# Patient Record
Sex: Female | Born: 2000 | Race: Black or African American | Hispanic: No | Marital: Single | Smoking: Never smoker
Health system: Southern US, Community
[De-identification: ages and names within clinical notes are randomized; demographics above are authoritative.]

## PROBLEM LIST (undated history)

## (undated) DIAGNOSIS — N6320 Unspecified lump in the left breast, unspecified quadrant: Secondary | ICD-10-CM

## (undated) DIAGNOSIS — M5126 Other intervertebral disc displacement, lumbar region: Secondary | ICD-10-CM

## (undated) DIAGNOSIS — M069 Rheumatoid arthritis, unspecified: Secondary | ICD-10-CM

## (undated) DIAGNOSIS — M419 Scoliosis, unspecified: Secondary | ICD-10-CM

## (undated) HISTORY — PX: WISDOM TOOTH EXTRACTION: SHX21

## (undated) HISTORY — PX: BACK SURGERY: SHX140

---

## 2018-05-20 ENCOUNTER — Encounter (HOSPITAL_COMMUNITY): Payer: Self-pay | Admitting: Emergency Medicine

## 2018-05-20 ENCOUNTER — Emergency Department (HOSPITAL_COMMUNITY)
Admission: EM | Admit: 2018-05-20 | Discharge: 2018-05-20 | Disposition: A | Discharge status: Home / Self Care | Payer: PRIVATE HEALTH INSURANCE | Attending: Emergency Medicine | Admitting: Emergency Medicine

## 2018-05-20 DIAGNOSIS — M5441 Lumbago with sciatica, right side: Secondary | ICD-10-CM | POA: Insufficient documentation

## 2018-05-20 DIAGNOSIS — R300 Dysuria: Secondary | ICD-10-CM | POA: Insufficient documentation

## 2018-05-20 DIAGNOSIS — G8929 Other chronic pain: Secondary | ICD-10-CM | POA: Insufficient documentation

## 2018-05-20 DIAGNOSIS — M419 Scoliosis, unspecified: Secondary | ICD-10-CM | POA: Diagnosis not present

## 2018-05-20 DIAGNOSIS — M069 Rheumatoid arthritis, unspecified: Secondary | ICD-10-CM | POA: Diagnosis not present

## 2018-05-20 HISTORY — DX: Rheumatoid arthritis, unspecified: M06.9

## 2018-05-20 HISTORY — DX: Scoliosis, unspecified: M41.9

## 2018-05-20 HISTORY — DX: Unspecified lump in the left breast, unspecified quadrant: N63.20

## 2018-05-20 HISTORY — DX: Other intervertebral disc displacement, lumbar region: M51.26

## 2018-05-20 LAB — URINALYSIS, ROUTINE W REFLEX MICROSCOPIC
Bilirubin Urine: NEGATIVE
Glucose, UA: NEGATIVE mg/dL
Hgb urine dipstick: NEGATIVE
KETONES UR: NEGATIVE mg/dL
Leukocytes,Ua: NEGATIVE
Nitrite: POSITIVE — AB
Protein, ur: NEGATIVE mg/dL
Specific Gravity, Urine: 1.017 (ref 1.005–1.030)
pH: 6 (ref 5.0–8.0)

## 2018-05-20 MED ORDER — CELECOXIB 200 MG PO CAPS: 200.0000 mg | ORAL_CAPSULE | Freq: Two times a day (BID) | ORAL | 0 refills | Status: DC

## 2018-05-20 NOTE — ED Notes (Signed)
ED Provider at bedside. 

## 2018-05-20 NOTE — ED Triage Notes (Signed)
Pt reports severe back pain due to rheumatoid arthritis (not on meds for it since she moved), pt states it is worse than normal today and was unable to go to class due to the pain.

## 2018-05-20 NOTE — Discharge Instructions (Signed)
Your urine did not show infection but culture was sent.  You will be notified if it shows an infection.

## 2018-05-20 NOTE — ED Notes (Signed)
Urine Culture sent to main lab with UA.  

## 2018-05-20 NOTE — ED Provider Notes (Signed)
[facility] EMERGENCY DEPARTMENT Provider Note   CSN: [id number] Arrival date & time: 05/20/18  1131     History   Chief Complaint Chief Complaint  Patient presents with  . Back Pain    HPI Molly Mahoney is a 18 y.o. female.  HPI Patient has a history of rheumatoid arthritis.  Has been off her meds for a while.  Has been mostly on Celebrex 200 mg twice a day for pain control.  Was not any other suppressing drugs.  States she ran out of the Celebrex around 3 weeks ago.  Pain now increasing.  It is in her lower back and does go to the right leg.  This is her typical pain for her.  No fevers or chills.  No cough.  No new numbness or weakness.  Denies any possibility of pregnancy.  Denies vaginal discharge.  States that she has had some foul-smelling urine and is worried she could have an infection.  No chest pain.  No trauma. Past Medical History:  Diagnosis Date  . Left breast lump    benign  . Lumbar herniated disc   . Rheumatoid arthritis (HCC)   . Scoliosis     There are no active problems to display for this patient.   Past Surgical History:  Procedure Laterality Date  . BACK SURGERY    . WISDOM TOOTH EXTRACTION       OB History   No obstetric history on file.      Home Medications    Prior to Admission medications   Medication Sig Start Date End Date Taking? Authorizing Provider  celecoxib (CELEBREX) 200 MG capsule Take 1 capsule (200 mg total) by mouth 2 (two) times daily. 05/20/18   Benjiman Core, MD    Family History History reviewed. No pertinent family history.  Social History Social History   Tobacco Use  . Smoking status: Never Smoker  . Smokeless tobacco: Never Used  Substance Use Topics  . Alcohol use: Not Currently  . Drug use: Never     Allergies   Cortizone-10 [hydrocortisone]   Review of Systems Review of Systems  Constitutional: Negative for chills and fever.  Cardiovascular: Negative for chest  pain.  Gastrointestinal: Negative for abdominal pain.  Genitourinary: Positive for dysuria.  Musculoskeletal: Positive for back pain. Negative for gait problem.  Skin: Negative for rash.  Neurological: Negative for weakness and numbness.  Psychiatric/Behavioral: Negative for confusion.     Physical Exam Updated Vital Signs BP 136/69 (BP Location: Right Arm)   Pulse (!) 111   Temp 98.4 F (36.9 C) (Oral)   Resp 18   LMP 04/30/2018 (Exact Date)   SpO2 98%   Physical Exam HENT:     Head: Atraumatic.     Mouth/Throat:     Mouth: Mucous membranes are moist.  Eyes:     Extraocular Movements: Extraocular movements intact.  Neck:     Musculoskeletal: Neck supple.  Cardiovascular:     Rate and Rhythm: Regular rhythm.  Pulmonary:     Breath sounds: Normal breath sounds.  Abdominal:     Tenderness: There is no abdominal tenderness.  Musculoskeletal:     Comments: Mild lumbar paraspinal tenderness.  No deformity.  Skin:    Capillary Refill: Capillary refill takes less than 2 seconds.  Neurological:     General: No focal deficit present.     Mental Status: She is alert.     Comments: Sensation intact in both lower legs.  Normal gait.  Psychiatric:        Mood and Affect: Mood normal.      ED Treatments / Results  Labs (all labs ordered are listed, but only abnormal results are displayed) Labs Reviewed  URINALYSIS, ROUTINE W REFLEX MICROSCOPIC - Abnormal; Notable for the following components:      Result Value   APPearance HAZY (*)    Nitrite POSITIVE (*)    Bacteria, UA RARE (*)    All other components within normal limits  URINE CULTURE    EKG None  Radiology No results found.  Procedures Procedures (including critical care time)  Medications Ordered in ED Medications - No data to display   Initial Impression / Assessment and Plan / ED Course  I have reviewed the triage vital signs and the nursing notes.  Pertinent labs & imaging results that were  available during my care of the patient were reviewed by me and considered in my medical decision making (see chart for details).     Patient with acute on chronic back pain secondary to rheumatoid arthritis.  Out of her Celebrex.  Benign exam.  Celebrex refilled.  Also has had some dysuria.  Urine nonspecific.  Culture sent.  Discharge home.  Patient denies possibility of pregnancy  Final Clinical Impressions(s) / ED Diagnoses   Final diagnoses:  Rheumatoid arthritis, involving unspecified site, unspecified rheumatoid factor presence (HCC)  Chronic low back pain with right-sided sciatica, unspecified back pain laterality    ED Discharge Orders         Ordered    celecoxib (CELEBREX) 200 MG capsule  2 times daily     05/20/18 1343           Benjiman Core, MD 05/20/18 1350

## 2018-05-20 NOTE — ED Notes (Signed)
Patient verbalizes understanding of discharge instructions. Opportunity for questioning and answers were provided. Armband removed by staff, pt discharged from ED.  

## 2018-05-22 LAB — URINE CULTURE: Culture: >=100000 — AB

## 2018-05-23 ENCOUNTER — Telehealth: Payer: Self-pay | Admitting: Emergency Medicine

## 2018-05-23 NOTE — Progress Notes (Signed)
ED Antimicrobial Stewardship Positive Culture Follow Up   Molly Mahoney is an 18 y.o. female who presented to [facility] on 05/20/2018 with a chief complaint of  Chief Complaint  Patient presents with  . Back Pain    Recent Results (from the past 720 hour(s))  Urine culture     Status: Abnormal   Collection Time: 05/20/18  1:00 PM  Result Value Ref Range Status   Specimen Description URINE, RANDOM  Final   Special Requests   Final    NONE Performed at [facility], [address] [place], [place] [address]    Culture >=100,000 COLONIES/mL ESCHERICHIA COLI (A)  Final   Report Status 05/22/2018 FINAL  Final   Organism ID, Bacteria ESCHERICHIA COLI (A)  Final      Susceptibility   Escherichia coli - MIC*    AMPICILLIN 4 SENSITIVE Sensitive     CEFAZOLIN <=4 SENSITIVE Sensitive     CEFTRIAXONE <=1 SENSITIVE Sensitive     CIPROFLOXACIN <=0.25 SENSITIVE Sensitive     GENTAMICIN <=1 SENSITIVE Sensitive     IMIPENEM <=0.25 SENSITIVE Sensitive     NITROFURANTOIN <=16 SENSITIVE Sensitive     TRIMETH/SULFA >=320 RESISTANT Resistant     AMPICILLIN/SULBACTAM <=2 SENSITIVE Sensitive     PIP/TAZO <=4 SENSITIVE Sensitive     Extended ESBL NEGATIVE Sensitive     * >=100,000 COLONIES/mL ESCHERICHIA COLI    [x]  Patient discharged originally without antimicrobial agent and treatment is now indicated  New antibiotic prescription: Keflex 500 mg po BID for 10 days.  ED Provider: Army Melia, PA-C   Fayne Norrie 05/23/2018, 10:52 AM Clinical Pharmacist Monday - Friday phone -  [phone] Saturday - Sunday phone - [phone]

## 2018-05-23 NOTE — Telephone Encounter (Signed)
Post ED Visit - Positive Culture Follow-up: Successful Patient Follow-Up  Culture assessed and recommendations reviewed by:  []  Enzo Bi, Pharm.D. []  Celedonio Miyamoto, [address].D., BCPS AQ-ID []  Garvin Fila, Pharm.D., BCPS []  Georgina Pillion, Pharm.D., BCPS []  [place], [address].D., BCPS, AAHIVP []  Estella Husk, Pharm.D., BCPS, AAHIVP []  Lysle Pearl, PharmD, BCPS []  Phillips Climes, PharmD, BCPS []  Agapito Games, PharmD, BCPS []  Verlan Friends, PharmD Link Snuffer PharmD  Positive urine culture  [x]  Patient discharged without antimicrobial prescription and treatment is now indicated []  Organism is resistant to prescribed ED discharge antimicrobial []  Patient with positive blood cultures  Changes discussed with ED provider: Army Melia PA New antibiotic prescription start Keflex 500mg  po bid x 10 days  Attempting to contact patient   Berle Mull 05/23/2018, 12:53 PM

## 2018-05-26 ENCOUNTER — Encounter (HOSPITAL_COMMUNITY): Payer: Self-pay | Admitting: *Deleted

## 2018-05-26 ENCOUNTER — Emergency Department (HOSPITAL_COMMUNITY)
Admission: EM | Admit: 2018-05-26 | Discharge: 2018-05-26 | Disposition: A | Discharge status: Home / Self Care | Payer: PRIVATE HEALTH INSURANCE | Attending: Emergency Medicine | Admitting: Emergency Medicine

## 2018-05-26 DIAGNOSIS — R110 Nausea: Secondary | ICD-10-CM | POA: Insufficient documentation

## 2018-05-26 LAB — POC URINE PREG, ED: Preg Test, Ur: NEGATIVE

## 2018-05-26 MED ORDER — ONDANSETRON 4 MG PO TBDP
4.0000 mg | ORAL_TABLET | Freq: Once | ORAL | Status: AC
  Administered 2018-05-26: 4 mg via ORAL
  Filled 2018-05-26: qty 1

## 2018-05-26 MED ORDER — ONDANSETRON HCL 4 MG PO TABS: 4.0000 mg | ORAL_TABLET | Freq: Four times a day (QID) | ORAL | 0 refills | Status: DC

## 2018-05-26 NOTE — ED Triage Notes (Signed)
Pt in c/o nausea and back pain, history of RA and has been taking NSAIDS for her pain, unsure if that is causing her nausea

## 2018-05-26 NOTE — Discharge Instructions (Addendum)
Please read attached information. If you experience any new or worsening signs or symptoms please return to the emergency room for evaluation. Please follow-up with your primary care provider or specialist as discussed. Please use medication prescribed only as directed and discontinue taking if you have any concerning signs or symptoms.   °

## 2018-05-26 NOTE — ED Provider Notes (Signed)
[facility] EMERGENCY DEPARTMENT Provider Note   CSN: [id number] Arrival date & time: 05/26/18  1231    History   Chief Complaint Chief Complaint  Patient presents with  . Nausea    HPI Bernise Mkenzie Tacker is a 18 y.o. female.     HPI   18 year old female with a history of rheumatoid arthritis presents today with complaints of pain and nausea.  Patient notes chronic pain that is generally resolved with Celebrex.  She notes she recently moved here from [place] via [place].  She was seen in the emergency room last week with pain.  She was given prescription for her Celebrex which she notes has been improving her symptoms.  She notes last night she took her Celebrex developed nausea and was unable to keep food or drink down.  Patient notes she has tolerated p.o. today but still is nauseous and having chronic back pain typical of previous pain.  She denies any fever, denies any abdominal pain.  She notes she is hungry but does not want to eat secondary to nausea..   Past Medical History:  Diagnosis Date  . Left breast lump    benign  . Lumbar herniated disc   . Rheumatoid arthritis (HCC)   . Scoliosis     There are no active problems to display for this patient.   Past Surgical History:  Procedure Laterality Date  . BACK SURGERY    . WISDOM TOOTH EXTRACTION       OB History   No obstetric history on file.      Home Medications    Prior to Admission medications   Medication Sig Start Date End Date Taking? Authorizing Provider  celecoxib (CELEBREX) 200 MG capsule Take 1 capsule (200 mg total) by mouth 2 (two) times daily. 05/20/18   Benjiman Core, MD  ondansetron (ZOFRAN) 4 MG tablet Take 1 tablet (4 mg total) by mouth every 6 (six) hours. 05/26/18   Eyvonne Mechanic, PA-C    Family History History reviewed. No pertinent family history.  Social History Social History   Tobacco Use  . Smoking status: Never Smoker  . Smokeless tobacco:  Never Used  Substance Use Topics  . Alcohol use: Not Currently  . Drug use: Never     Allergies   Cortizone-10 [hydrocortisone]   Review of Systems Review of Systems  All other systems reviewed and are negative.    Physical Exam Updated Vital Signs BP 112/79 (BP Location: Left Arm)   Pulse 83   Temp 98.7 F (37.1 C) (Oral)   Resp 18   LMP 04/30/2018 (Exact Date)   SpO2 100%   Physical Exam Vitals signs and nursing note reviewed.  Constitutional:      Appearance: She is well-developed.  HENT:     Head: Normocephalic and atraumatic.  Eyes:     General: No scleral icterus.       Right eye: No discharge.        Left eye: No discharge.     Conjunctiva/sclera: Conjunctivae normal.     Pupils: Pupils are equal, round, and reactive to light.  Neck:     Musculoskeletal: Normal range of motion.     Vascular: No JVD.     Trachea: No tracheal deviation.  Pulmonary:     Effort: Pulmonary effort is normal.     Breath sounds: No stridor.  Abdominal:     Comments: Abdomen soft nontender  Musculoskeletal:     Comments: Generalized tenderness to  palpation of the cervical thoracic and lumbar regions, nonfocal, no rashes, distal sensation strength motor function intact  Neurological:     Mental Status: She is alert and oriented to person, place, and time.     Coordination: Coordination normal.  Psychiatric:        Behavior: Behavior normal.        Thought Content: Thought content normal.        Judgment: Judgment normal.      ED Treatments / Results  Labs (all labs ordered are listed, but only abnormal results are displayed) Labs Reviewed  POC URINE PREG, ED    EKG None  Radiology No results found.  Procedures Procedures (including critical care time)  Medications Ordered in ED Medications  ondansetron (ZOFRAN-ODT) disintegrating tablet 4 mg (4 mg Oral Given 05/26/18 1530)     Initial Impression / Assessment and Plan / ED Course  I have reviewed the  triage vital signs and the nursing notes.  Pertinent labs & imaging results that were available during my care of the patient were reviewed by me and considered in my medical decision making (see chart for details).        18 year old female presents today with nausea and vomiting.  She is tolerating p.o. prior to my evaluation she will given Zofran.  If she continues to be able to tolerate food and drink she will be discharged home with instructions to continue using Celebrex as needed return immediately if she develops any new or worsening signs or symptoms and follow-up as an outpatient with her primary care.  Patient has no abdominal tenderness low suspicion for acute intra-abdominal pathology.  Patient verbalized understanding and agreement to today's plan    Final Clinical Impressions(s) / ED Diagnoses   Final diagnoses:  Nausea    ED Discharge Orders         Ordered    ondansetron (ZOFRAN) 4 MG tablet  Every 6 hours     05/26/18 1559           Eyvonne MechanicHedges, Gabriel Conry, PA-C 05/26/18 1612    Lorre NickAllen, Anthony, MD 05/31/18 [id number]

## 2018-05-26 NOTE — ED Notes (Signed)
Patient given discharge instructions and verbalized understanding.  Patient stable to discharge at this time.  Patient is alert and oriented to baseline.  No distressed noted at this time.  All belongings taken with the patient at discharge.   

## 2020-06-13 ENCOUNTER — Other Ambulatory Visit: Payer: Self-pay | Admitting: Nurse Practitioner

## 2020-06-13 DIAGNOSIS — N6320 Unspecified lump in the left breast, unspecified quadrant: Secondary | ICD-10-CM

## 2020-07-26 ENCOUNTER — Other Ambulatory Visit: Payer: Self-pay

## 2020-07-26 ENCOUNTER — Ambulatory Visit
Admission: RE | Admit: 2020-07-26 | Discharge: 2020-07-26 | Disposition: A | Discharge status: Home / Self Care | Payer: PRIVATE HEALTH INSURANCE | Source: Ambulatory Visit | Attending: Nurse Practitioner | Admitting: Nurse Practitioner

## 2020-07-26 DIAGNOSIS — N6320 Unspecified lump in the left breast, unspecified quadrant: Secondary | ICD-10-CM

## 2020-10-09 ENCOUNTER — Other Ambulatory Visit: Payer: Self-pay

## 2020-10-09 ENCOUNTER — Encounter: Payer: Self-pay | Admitting: Plastic Surgery

## 2020-10-09 ENCOUNTER — Ambulatory Visit (INDEPENDENT_AMBULATORY_CARE_PROVIDER_SITE_OTHER): Payer: PRIVATE HEALTH INSURANCE | Admitting: Plastic Surgery

## 2020-10-09 DIAGNOSIS — N62 Hypertrophy of breast: Secondary | ICD-10-CM

## 2020-10-09 DIAGNOSIS — G8929 Other chronic pain: Secondary | ICD-10-CM

## 2020-10-09 DIAGNOSIS — M549 Dorsalgia, unspecified: Secondary | ICD-10-CM | POA: Insufficient documentation

## 2020-10-09 DIAGNOSIS — M546 Pain in thoracic spine: Secondary | ICD-10-CM

## 2020-10-09 DIAGNOSIS — M542 Cervicalgia: Secondary | ICD-10-CM

## 2020-10-09 NOTE — Progress Notes (Signed)
Patient ID: Molly Mahoney, adult    DOB: 12-11-2000, 20 y.o.   MRN: [id number]   Chief Complaint  Patient presents with   Advice Only   Breast Problem    Mammary Hyperplasia: The patient is a 20 y.o. female with a history of mammary hyperplasia for several years.  She has extremely large breasts causing symptoms that include the following: Back pain in the upper and lower back, including neck pain. She pulls or pins her bra straps to provide better lift and relief of the pressure and pain. She notices relief by holding her breast up manually.  Her shoulder straps cause grooves and pain and pressure that requires padding for relief. Pain medication is sometimes required with motrin and tylenol.  Activities that are hindered by enlarged breasts include: exercise and running.  She has tried supportive clothing as well as fitted bras without improvement.  Her breasts are extremely large and fairly symmetric.  She has hyperpigmentation of the inframammary area on both sides.  The sternal to nipple distance on the right is 38 cm and the left is 38 cm.  The IMF distance is 18 cm.  She is 5 feet 5 inches tall and weighs 248 pounds.  The BMI = 41.3 kilograms per meter square.  Preoperative bra size = 38G cup.  The patient would like to be a C /D cup.  The estimated excess breast tissue to be removed at the time of surgery = 800 grams on the left and 800 grams on the right.  Mammogram history: Ultrasound with fibroid of left breast.  Family history of breast cancer: Paternal grandmother.  Tobacco use: Occasionally.   The patient expresses the desire to pursue surgical intervention.  The patient has had 2 spine surgeries for herniated disks in [place].  She also has scoliosis.  She has been to physical therapy in the past without significant improvement in her back pain.    Review of Systems  Constitutional:  Positive for activity change. Negative for appetite change.  HENT: Negative.    Eyes:  Negative.   Respiratory: Negative.    Cardiovascular: Negative.   Gastrointestinal: Negative.   Endocrine: Negative.   Genitourinary: Negative.   Musculoskeletal:  Positive for back pain and neck pain.  Neurological: Negative.   Psychiatric/Behavioral: Negative.     Past Medical History:  Diagnosis Date   Left breast lump    benign   Lumbar herniated disc    Rheumatoid arthritis (HCC)    Scoliosis     Past Surgical History:  Procedure Laterality Date   BACK SURGERY     WISDOM TOOTH EXTRACTION        Current Outpatient Medications:    FLUoxetine (PROZAC) 20 MG capsule, Take 20 mg by mouth every morning., Disp: , Rfl:    hydrOXYzine (ATARAX/VISTARIL) 50 MG tablet, Take 50 mg by mouth 3 (three) times daily as needed., Disp: , Rfl:    methylphenidate (RITALIN) 5 MG tablet, Take 5 mg by mouth 2 (two) times daily., Disp: , Rfl:    traZODone (DESYREL) 50 MG tablet, Take 50 mg by mouth at bedtime., Disp: , Rfl:    Objective:   Vitals:   10/09/20 1005  BP: 127/86  Pulse: 84  SpO2: 97%    Physical Exam Vitals and nursing note reviewed.  Constitutional:      Appearance: Normal appearance.  HENT:     Head: Normocephalic and atraumatic.  Cardiovascular:     Rate and  Rhythm: Normal rate.     Pulses: Normal pulses.  Pulmonary:     Effort: Pulmonary effort is normal.  Abdominal:     General: Abdomen is flat. There is no distension.     Tenderness: There is no abdominal tenderness.  Musculoskeletal:        General: No swelling or deformity.  Skin:    General: Skin is warm.     Coloration: Skin is not jaundiced.     Findings: No bruising or lesion.  Neurological:     General: No focal deficit present.     Mental Status: Shuntel Corianna Avallone is alert and oriented to person, place, and time.  Psychiatric:        Mood and Affect: Mood normal.        Behavior: Behavior normal.        Thought Content: Thought content normal.        Judgment: Judgment normal.     Assessment & Plan:  Symptomatic mammary hypertrophy  Chronic bilateral thoracic back pain  Neck pain  The procedure the patient selected and that was best for the patient was discussed. The risk were discussed and include but not limited to the following:  Breast asymmetry, fluid accumulation, firmness of the breast, inability to breast feed, loss of nipple or areola, skin loss, change in skin and nipple sensation, fat necrosis of the breast tissue, bleeding, infection and healing delay.  There are risks of anesthesia and injury to nerves or blood vessels.  Allergic reaction to tape, suture and skin glue are possible.  There will be swelling.  Any of these can lead to the need for revisional surgery.  A breast reduction has potential to interfere with diagnostic procedures in the future.  This procedure is best done when the breast is fully developed.  Changes in the breast will continue to occur over time: pregnancy, weight gain or weigh loss.    Total time: 45 minutes. This includes time spent with the patient during the visit as well as time spent before and after the visit reviewing the chart, documenting the encounter and ordering pertinent studies. and literature emailed to the patient.   Physical therapy: We will arrange for physical therapy Mammogram: Release of information for ultrasound of breast No smoking for 3 months prior to surgery if at all possible and at least a minimum of 6 weeks.  We will work with general surgery for bilateral breast reduction with possible liposuction.  Pictures were obtained of the patient and placed in the chart with the patient's or guardian's permission.    Alena Bills Collyns Mcquigg, DO

## 2020-10-22 ENCOUNTER — Ambulatory Visit: Payer: PRIVATE HEALTH INSURANCE | Attending: Plastic Surgery

## 2022-05-30 IMAGING — US US BREAST*L* LIMITED INC AXILLA
1 series · 7 of 7 positions shown · non-contrast
Comparison: Previous exam(s).
COMPARISON: Previous exam(s).

Addendum:
CLINICAL DATA: Patient presents for evaluation of palpable
abnormality within the left breast. Patient previously had the
palpable mass evaluated dating back to 2391. Patient desires
surgical consultation for excision.

EXAM:
ULTRASOUND OF THE LEFT BREAST

[Series 1: us breast*left* limited inc axilla · 0.06mm/px · 7 of 7 slices shown]
[im 1/7]
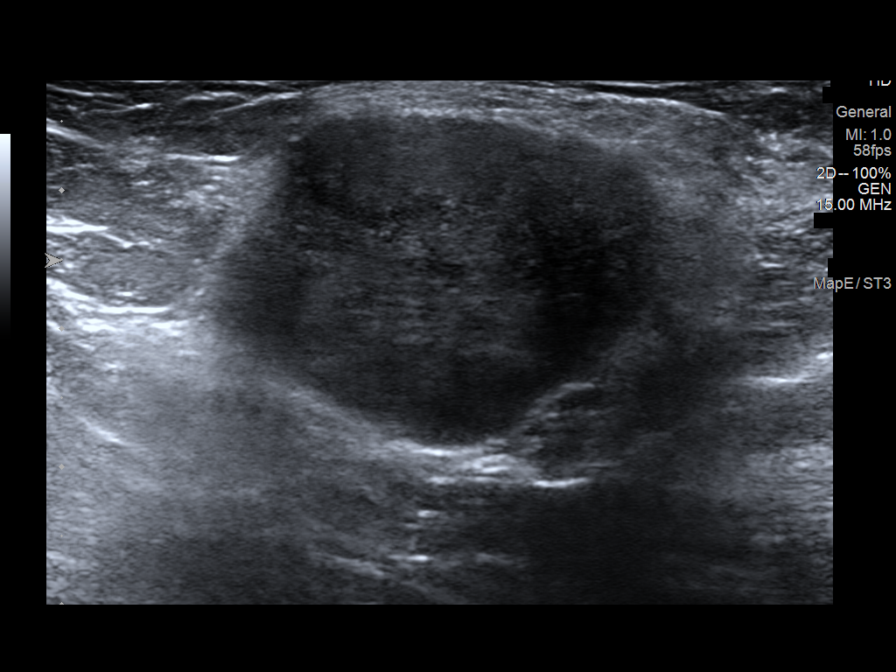
[im 2/7]
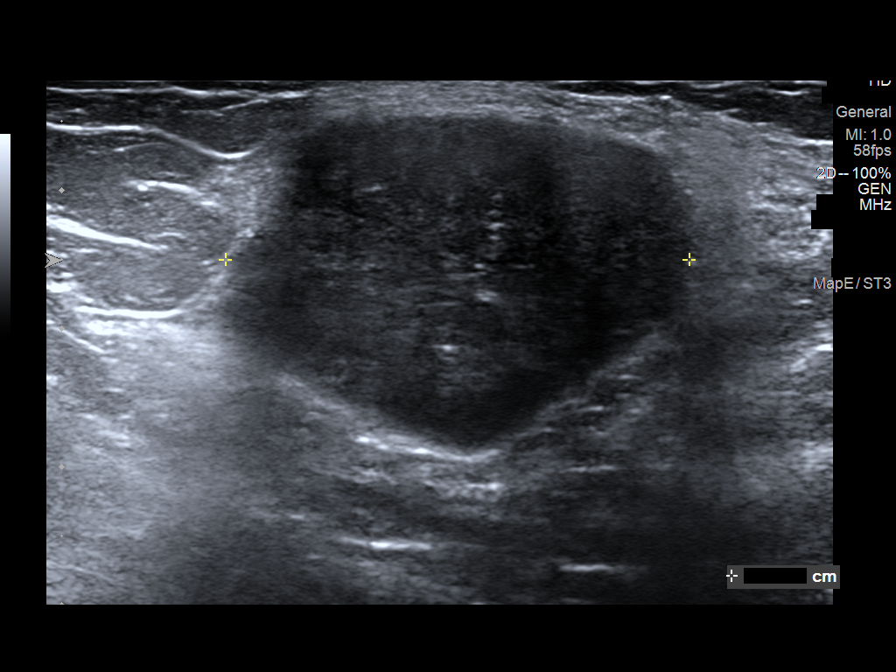
[im 3/7]
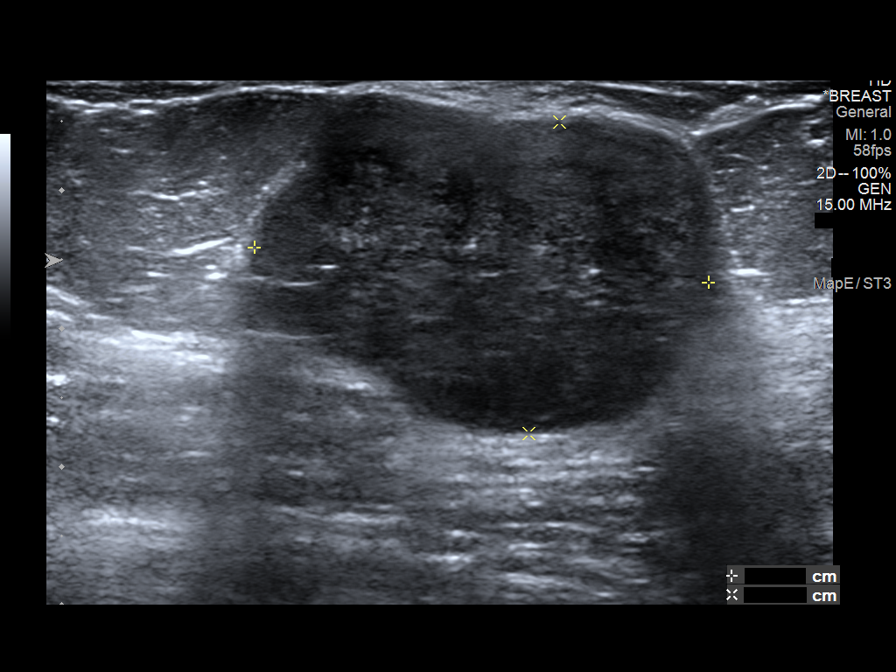
[im 4/7]
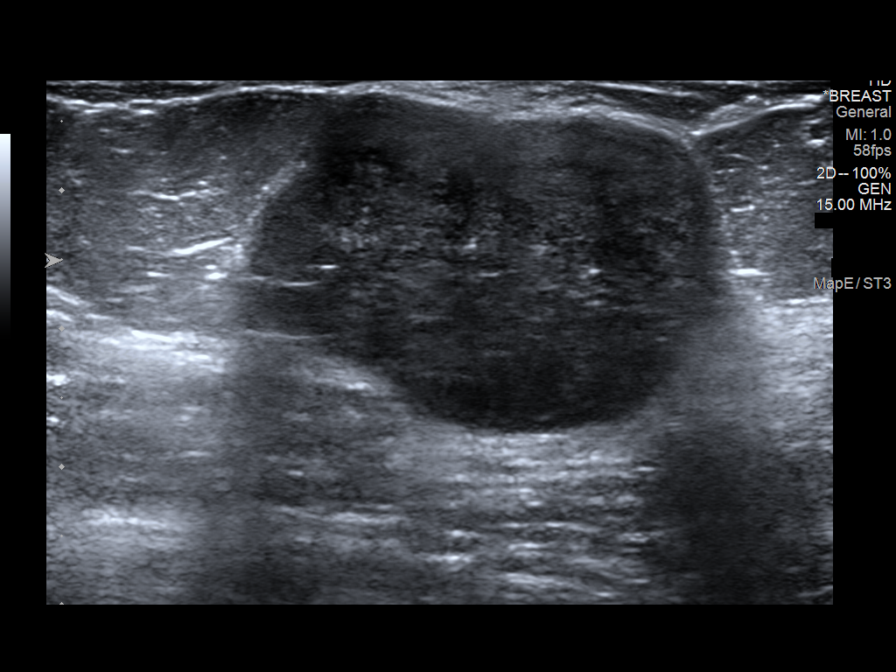
[im 5/7]
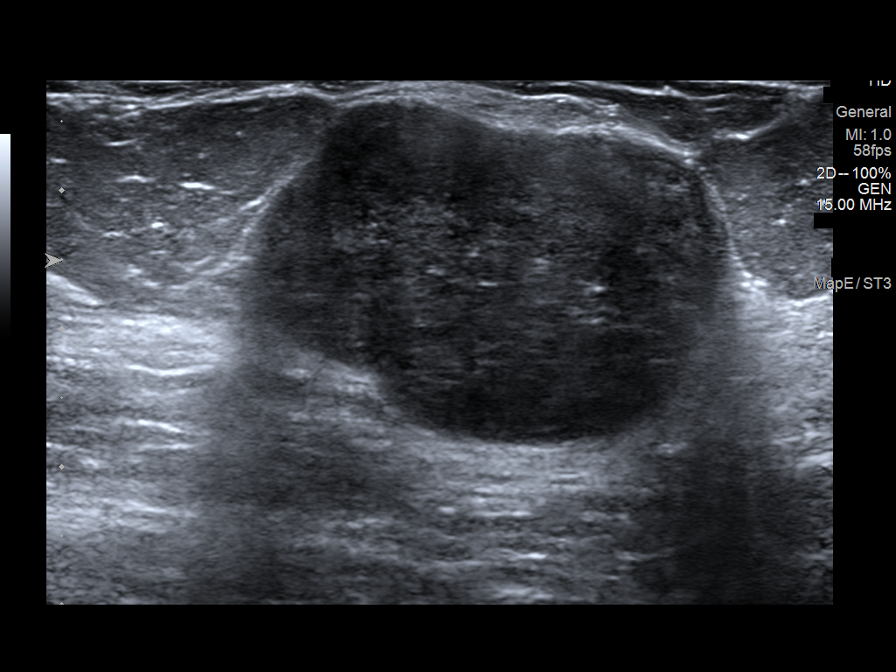
[im 6/7]
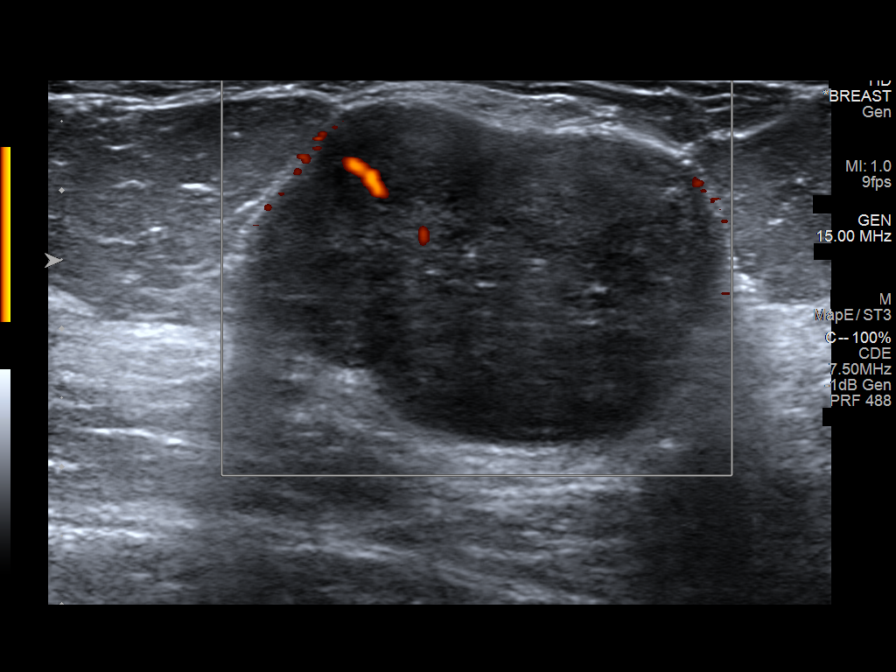
[im 7/7]
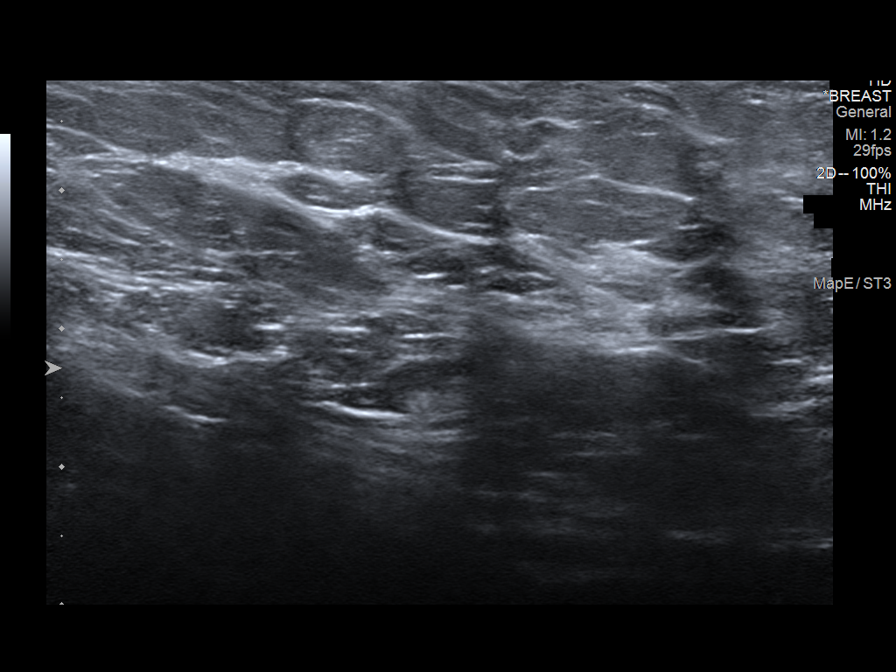

[7 of 7 positions shown; findings below may reference images not displayed]

FINDINGS: Targeted ultrasound is performed, showing a 3.4 x 3.3 x 2.4 cm oval
circumscribed hypoechoic mass left breast 2 o'clock position 10 cm
from the nipple at the site of palpable concern. This is not
significantly changed in size when compared to prior exams.
IMPRESSION: Grossly similar left breast mass 2 o'clock position at the site of
palpable concern, likely representing a large fibroadenoma.

RECOMMENDATION:
Surgical consultation for consideration of excision as the patient
desires to have the mass removed given the size and palpable nature.

I have discussed the findings and recommendations with the patient.
If applicable, a reminder letter will be sent to the patient
regarding the next appointment.

BI-RADS CATEGORY  2: Benign.

ADDENDUM:
Surgical consultation has been arranged with Dr. Baldev Pop at
[REDACTED] on August 23, 2020.

Hoi Tik Kamho RN on 07/27/2020

*** End of Addendum ***
FINDINGS: Targeted ultrasound is performed, showing a 3.4 x 3.3 x 2.4 cm oval
circumscribed hypoechoic mass left breast 2 o'clock position 10 cm
from the nipple at the site of palpable concern. This is not
significantly changed in size when compared to prior exams.
IMPRESSION: Grossly similar left breast mass 2 o'clock position at the site of
palpable concern, likely representing a large fibroadenoma.

RECOMMENDATION:
Surgical consultation for consideration of excision as the patient
desires to have the mass removed given the size and palpable nature.

I have discussed the findings and recommendations with the patient.
If applicable, a reminder letter will be sent to the patient
regarding the next appointment.

BI-RADS CATEGORY  2: Benign.

## 2022-08-11 ENCOUNTER — Telehealth: Payer: Self-pay

## 2022-08-11 NOTE — Telephone Encounter (Signed)
LVM for patient to call back. AS, CMA 

## 2023-08-11 DIAGNOSIS — M5441 Lumbago with sciatica, right side: Secondary | ICD-10-CM | POA: Diagnosis not present

## 2023-08-11 DIAGNOSIS — M0800 Unspecified juvenile rheumatoid arthritis of unspecified site: Secondary | ICD-10-CM | POA: Diagnosis not present

## 2023-08-11 DIAGNOSIS — G8929 Other chronic pain: Secondary | ICD-10-CM | POA: Diagnosis not present

## 2023-08-27 DIAGNOSIS — F419 Anxiety disorder, unspecified: Secondary | ICD-10-CM | POA: Diagnosis not present

## 2023-08-27 DIAGNOSIS — E785 Hyperlipidemia, unspecified: Secondary | ICD-10-CM | POA: Diagnosis not present

## 2023-08-27 DIAGNOSIS — K219 Gastro-esophageal reflux disease without esophagitis: Secondary | ICD-10-CM | POA: Diagnosis not present

## 2023-08-27 DIAGNOSIS — F909 Attention-deficit hyperactivity disorder, unspecified type: Secondary | ICD-10-CM | POA: Diagnosis not present

## 2023-08-27 DIAGNOSIS — E119 Type 2 diabetes mellitus without complications: Secondary | ICD-10-CM | POA: Diagnosis not present

## 2023-08-27 DIAGNOSIS — L732 Hidradenitis suppurativa: Secondary | ICD-10-CM | POA: Diagnosis not present

## 2023-08-27 DIAGNOSIS — M459 Ankylosing spondylitis of unspecified sites in spine: Secondary | ICD-10-CM | POA: Diagnosis not present

## 2023-08-27 DIAGNOSIS — I10 Essential (primary) hypertension: Secondary | ICD-10-CM | POA: Diagnosis not present

## 2023-09-03 DIAGNOSIS — M25511 Pain in right shoulder: Secondary | ICD-10-CM | POA: Diagnosis not present

## 2023-09-03 DIAGNOSIS — M542 Cervicalgia: Secondary | ICD-10-CM | POA: Diagnosis not present

## 2023-09-03 DIAGNOSIS — M25512 Pain in left shoulder: Secondary | ICD-10-CM | POA: Diagnosis not present

## 2023-09-09 DIAGNOSIS — M542 Cervicalgia: Secondary | ICD-10-CM | POA: Diagnosis not present

## 2023-09-09 DIAGNOSIS — Z6841 Body Mass Index (BMI) 40.0 and over, adult: Secondary | ICD-10-CM | POA: Diagnosis not present

## 2023-09-09 DIAGNOSIS — M069 Rheumatoid arthritis, unspecified: Secondary | ICD-10-CM | POA: Diagnosis not present

## 2023-09-09 DIAGNOSIS — M5441 Lumbago with sciatica, right side: Secondary | ICD-10-CM | POA: Diagnosis not present

## 2023-09-09 DIAGNOSIS — M25512 Pain in left shoulder: Secondary | ICD-10-CM | POA: Diagnosis not present

## 2023-09-09 DIAGNOSIS — G8929 Other chronic pain: Secondary | ICD-10-CM | POA: Diagnosis not present

## 2023-09-09 DIAGNOSIS — E66813 Obesity, class 3: Secondary | ICD-10-CM | POA: Diagnosis not present

## 2023-09-09 DIAGNOSIS — M25511 Pain in right shoulder: Secondary | ICD-10-CM | POA: Diagnosis not present

## 2023-09-09 DIAGNOSIS — M130 Polyarthritis, unspecified: Secondary | ICD-10-CM | POA: Diagnosis not present

## 2023-09-21 DIAGNOSIS — R937 Abnormal findings on diagnostic imaging of other parts of musculoskeletal system: Secondary | ICD-10-CM | POA: Diagnosis not present

## 2023-09-21 DIAGNOSIS — M0800 Unspecified juvenile rheumatoid arthritis of unspecified site: Secondary | ICD-10-CM | POA: Diagnosis not present

## 2023-09-21 DIAGNOSIS — M461 Sacroiliitis, not elsewhere classified: Secondary | ICD-10-CM | POA: Diagnosis not present

## 2023-09-21 DIAGNOSIS — M5441 Lumbago with sciatica, right side: Secondary | ICD-10-CM | POA: Diagnosis not present

## 2023-09-21 DIAGNOSIS — G8929 Other chronic pain: Secondary | ICD-10-CM | POA: Diagnosis not present

## 2023-10-28 DIAGNOSIS — G8929 Other chronic pain: Secondary | ICD-10-CM | POA: Diagnosis not present

## 2023-10-28 DIAGNOSIS — M5441 Lumbago with sciatica, right side: Secondary | ICD-10-CM | POA: Diagnosis not present

## 2023-11-18 DIAGNOSIS — F900 Attention-deficit hyperactivity disorder, predominantly inattentive type: Secondary | ICD-10-CM | POA: Diagnosis not present

## 2023-11-18 DIAGNOSIS — Z9189 Other specified personal risk factors, not elsewhere classified: Secondary | ICD-10-CM | POA: Diagnosis not present

## 2023-11-18 DIAGNOSIS — Z7689 Persons encountering health services in other specified circumstances: Secondary | ICD-10-CM | POA: Diagnosis not present

## 2023-11-18 DIAGNOSIS — G894 Chronic pain syndrome: Secondary | ICD-10-CM | POA: Diagnosis not present

## 2023-11-18 DIAGNOSIS — Z77120 Contact with and (suspected) exposure to mold (toxic): Secondary | ICD-10-CM | POA: Diagnosis not present

## 2023-11-18 DIAGNOSIS — Z5919 Other inadequate housing: Secondary | ICD-10-CM | POA: Diagnosis not present

## 2023-11-18 DIAGNOSIS — Z6841 Body Mass Index (BMI) 40.0 and over, adult: Secondary | ICD-10-CM | POA: Diagnosis not present

## 2023-11-18 DIAGNOSIS — M0880 Other juvenile arthritis, unspecified site: Secondary | ICD-10-CM | POA: Diagnosis not present

## 2023-11-18 DIAGNOSIS — E66813 Obesity, class 3: Secondary | ICD-10-CM | POA: Diagnosis not present

## 2023-11-19 DIAGNOSIS — Z79899 Other long term (current) drug therapy: Secondary | ICD-10-CM | POA: Diagnosis not present

## 2023-11-19 DIAGNOSIS — M47819 Spondylosis without myelopathy or radiculopathy, site unspecified: Secondary | ICD-10-CM | POA: Diagnosis not present

## 2023-11-19 DIAGNOSIS — M0800 Unspecified juvenile rheumatoid arthritis of unspecified site: Secondary | ICD-10-CM | POA: Diagnosis not present

## 2023-12-18 DIAGNOSIS — Z975 Presence of (intrauterine) contraceptive device: Secondary | ICD-10-CM | POA: Diagnosis not present

## 2023-12-18 DIAGNOSIS — Z6841 Body Mass Index (BMI) 40.0 and over, adult: Secondary | ICD-10-CM | POA: Diagnosis not present

## 2023-12-18 DIAGNOSIS — Z01419 Encounter for gynecological examination (general) (routine) without abnormal findings: Secondary | ICD-10-CM | POA: Diagnosis not present

## 2023-12-18 DIAGNOSIS — Z0001 Encounter for general adult medical examination with abnormal findings: Secondary | ICD-10-CM | POA: Diagnosis not present

## 2023-12-18 DIAGNOSIS — Z113 Encounter for screening for infections with a predominantly sexual mode of transmission: Secondary | ICD-10-CM | POA: Diagnosis not present

## 2023-12-18 DIAGNOSIS — R102 Pelvic and perineal pain: Secondary | ICD-10-CM | POA: Diagnosis not present

## 2024-01-07 DIAGNOSIS — M542 Cervicalgia: Secondary | ICD-10-CM | POA: Diagnosis not present

## 2024-01-07 DIAGNOSIS — G8929 Other chronic pain: Secondary | ICD-10-CM | POA: Diagnosis not present

## 2024-01-07 DIAGNOSIS — M545 Low back pain, unspecified: Secondary | ICD-10-CM | POA: Diagnosis not present

## 2024-01-11 DIAGNOSIS — M542 Cervicalgia: Secondary | ICD-10-CM | POA: Diagnosis not present

## 2024-01-11 DIAGNOSIS — G8929 Other chronic pain: Secondary | ICD-10-CM | POA: Diagnosis not present

## 2024-02-03 DIAGNOSIS — Z6841 Body Mass Index (BMI) 40.0 and over, adult: Secondary | ICD-10-CM | POA: Diagnosis not present

## 2024-02-03 DIAGNOSIS — E66813 Obesity, class 3: Secondary | ICD-10-CM | POA: Diagnosis not present

## 2024-02-03 DIAGNOSIS — Z23 Encounter for immunization: Secondary | ICD-10-CM | POA: Diagnosis not present

## 2024-02-03 DIAGNOSIS — Z7689 Persons encountering health services in other specified circumstances: Secondary | ICD-10-CM | POA: Diagnosis not present

## 2024-02-23 DIAGNOSIS — M0800 Unspecified juvenile rheumatoid arthritis of unspecified site: Secondary | ICD-10-CM | POA: Diagnosis not present

## 2024-02-23 DIAGNOSIS — M47819 Spondylosis without myelopathy or radiculopathy, site unspecified: Secondary | ICD-10-CM | POA: Diagnosis not present

## 2024-02-23 DIAGNOSIS — Z79899 Other long term (current) drug therapy: Secondary | ICD-10-CM | POA: Diagnosis not present

## 2024-03-09 DIAGNOSIS — E66813 Obesity, class 3: Secondary | ICD-10-CM | POA: Diagnosis not present

## 2024-03-09 DIAGNOSIS — Z6841 Body Mass Index (BMI) 40.0 and over, adult: Secondary | ICD-10-CM | POA: Diagnosis not present

## 2024-03-26 DIAGNOSIS — R300 Dysuria: Secondary | ICD-10-CM | POA: Diagnosis not present

## 2024-03-26 DIAGNOSIS — R3915 Urgency of urination: Secondary | ICD-10-CM | POA: Diagnosis not present

## 2024-03-26 DIAGNOSIS — R350 Frequency of micturition: Secondary | ICD-10-CM | POA: Diagnosis not present
